# Patient Record
Sex: Male | Born: 1975 | Race: Black or African American | Hispanic: No | Marital: Single | Smoking: Never smoker
Health system: Southern US, Community
[De-identification: ages and names within clinical notes are randomized; demographics above are authoritative.]

## PROBLEM LIST (undated history)

## (undated) DIAGNOSIS — M1990 Unspecified osteoarthritis, unspecified site: Secondary | ICD-10-CM

## (undated) DIAGNOSIS — K759 Inflammatory liver disease, unspecified: Secondary | ICD-10-CM

## (undated) DIAGNOSIS — I10 Essential (primary) hypertension: Secondary | ICD-10-CM

---

## 2014-08-04 DIAGNOSIS — K759 Inflammatory liver disease, unspecified: Secondary | ICD-10-CM

## 2014-08-04 HISTORY — DX: Inflammatory liver disease, unspecified: K75.9

## 2014-08-08 ENCOUNTER — Encounter (HOSPITAL_BASED_OUTPATIENT_CLINIC_OR_DEPARTMENT_OTHER): Payer: Self-pay | Admitting: *Deleted

## 2014-08-08 ENCOUNTER — Emergency Department (HOSPITAL_BASED_OUTPATIENT_CLINIC_OR_DEPARTMENT_OTHER): Payer: Medicaid - Out of State

## 2014-08-08 ENCOUNTER — Inpatient Hospital Stay (HOSPITAL_BASED_OUTPATIENT_CLINIC_OR_DEPARTMENT_OTHER)
Admission: EM | Admit: 2014-08-08 | Discharge: 2014-08-10 | DRG: 442 | Disposition: A | Discharge status: Home / Self Care | Payer: Medicaid - Out of State | Attending: Family Medicine | Admitting: Family Medicine

## 2014-08-08 DIAGNOSIS — R17 Unspecified jaundice: Secondary | ICD-10-CM | POA: Diagnosis present

## 2014-08-08 DIAGNOSIS — K759 Inflammatory liver disease, unspecified: Secondary | ICD-10-CM

## 2014-08-08 DIAGNOSIS — R945 Abnormal results of liver function studies: Secondary | ICD-10-CM | POA: Diagnosis present

## 2014-08-08 DIAGNOSIS — I10 Essential (primary) hypertension: Secondary | ICD-10-CM | POA: Diagnosis present

## 2014-08-08 DIAGNOSIS — B179 Acute viral hepatitis, unspecified: Secondary | ICD-10-CM | POA: Diagnosis present

## 2014-08-08 DIAGNOSIS — M4310 Spondylolisthesis, site unspecified: Secondary | ICD-10-CM | POA: Diagnosis present

## 2014-08-08 DIAGNOSIS — B169 Acute hepatitis B without delta-agent and without hepatic coma: Principal | ICD-10-CM | POA: Diagnosis present

## 2014-08-08 DIAGNOSIS — M479 Spondylosis, unspecified: Secondary | ICD-10-CM | POA: Diagnosis present

## 2014-08-08 DIAGNOSIS — R109 Unspecified abdominal pain: Secondary | ICD-10-CM | POA: Diagnosis present

## 2014-08-08 HISTORY — DX: Inflammatory liver disease, unspecified: K75.9

## 2014-08-08 HISTORY — DX: Unspecified osteoarthritis, unspecified site: M19.90

## 2014-08-08 HISTORY — DX: Essential (primary) hypertension: I10

## 2014-08-08 LAB — COMPREHENSIVE METABOLIC PANEL
ALT: 3747 U/L — AB (ref 0–53)
AST: 2536 U/L — ABNORMAL HIGH (ref 0–37)
Albumin: 3.6 g/dL (ref 3.5–5.2)
Alkaline Phosphatase: 153 U/L — ABNORMAL HIGH (ref 39–117)
Anion gap: 13 (ref 5–15)
BUN: 6 mg/dL (ref 6–23)
CHLORIDE: 100 meq/L (ref 96–112)
CO2: 24 meq/L (ref 19–32)
Calcium: 9.5 mg/dL (ref 8.4–10.5)
Creatinine, Ser: 1.1 mg/dL (ref 0.50–1.35)
GFR calc Af Amer: >90 mL/min (ref >90)
GFR, EST NON AFRICAN AMERICAN: 84 mL/min — AB (ref >90)
GLUCOSE: 94 mg/dL (ref 70–99)
POTASSIUM: 4.3 meq/L (ref 3.7–5.3)
SODIUM: 137 meq/L (ref 137–147)
TOTAL PROTEIN: 7.8 g/dL (ref 6.0–8.3)
Total Bilirubin: 9.1 mg/dL — ABNORMAL HIGH (ref 0.3–1.2)

## 2014-08-08 LAB — URINALYSIS, ROUTINE W REFLEX MICROSCOPIC
Glucose, UA: NEGATIVE mg/dL
HGB URINE DIPSTICK: NEGATIVE
Ketones, ur: NEGATIVE mg/dL
Leukocytes, UA: NEGATIVE
Nitrite: NEGATIVE
PH: 6 (ref 5.0–8.0)
Protein, ur: NEGATIVE mg/dL
SPECIFIC GRAVITY, URINE: 1.009 (ref 1.005–1.030)
UROBILINOGEN UA: 0.2 mg/dL (ref 0.0–1.0)

## 2014-08-08 LAB — CBC WITH DIFFERENTIAL/PLATELET
Basophils Absolute: 0 10*3/uL (ref 0.0–0.1)
Basophils Relative: 1 % (ref 0–1)
Eosinophils Absolute: 0.1 10*3/uL (ref 0.0–0.7)
Eosinophils Relative: 2 % (ref 0–5)
HCT: 38.7 % — ABNORMAL LOW (ref 39.0–52.0)
HEMOGLOBIN: 13.5 g/dL (ref 13.0–17.0)
LYMPHS ABS: 1.2 10*3/uL (ref 0.7–4.0)
LYMPHS PCT: 37 % (ref 12–46)
MCH: 31.3 pg (ref 26.0–34.0)
MCHC: 34.9 g/dL (ref 30.0–36.0)
MCV: 89.8 fL (ref 78.0–100.0)
MONOS PCT: 16 % — AB (ref 3–12)
Monocytes Absolute: 0.5 10*3/uL (ref 0.1–1.0)
NEUTROS PCT: 44 % (ref 43–77)
Neutro Abs: 1.5 10*3/uL — ABNORMAL LOW (ref 1.7–7.7)
Platelets: 367 10*3/uL (ref 150–400)
RBC: 4.31 MIL/uL (ref 4.22–5.81)
RDW: 15.4 % (ref 11.5–15.5)
WBC: 3.3 10*3/uL — AB (ref 4.0–10.5)

## 2014-08-08 LAB — LIPASE, BLOOD: Lipase: 46 U/L (ref 11–59)

## 2014-08-08 MED ORDER — DIPHENHYDRAMINE HCL 50 MG/ML IJ SOLN
25.0000 mg | Freq: Once | INTRAMUSCULAR | Status: AC
  Administered 2014-08-08: 25 mg via INTRAVENOUS
  Filled 2014-08-08: qty 1

## 2014-08-08 MED ORDER — SODIUM CHLORIDE 0.9 % IV BOLUS (SEPSIS)
1000.0000 mL | Freq: Once | INTRAVENOUS | Status: AC
  Administered 2014-08-08: 1000 mL via INTRAVENOUS

## 2014-08-08 MED ORDER — IOHEXOL 300 MG/ML  SOLN
25.0000 mL | Freq: Once | INTRAMUSCULAR | Status: AC | PRN
  Administered 2014-08-08: 25 mL via ORAL

## 2014-08-08 MED ORDER — IOHEXOL 300 MG/ML  SOLN
100.0000 mL | Freq: Once | INTRAMUSCULAR | Status: AC | PRN
  Administered 2014-08-08: 100 mL via INTRAVENOUS

## 2014-08-08 NOTE — ED Notes (Signed)
Abdominal pain x 2 weeks. Urine is dark. Headache.

## 2014-08-08 NOTE — ED Provider Notes (Signed)
CSN: [id number]     Arrival date & time 08/08/14  1933 History  This chart was scribed for Tyler Hurley Tyler Wynder, MD by Evon Slackerrance Branch, ED Scribe. This patient was seen in room MH05/MH05 and the patient's care was started at 8:27 PM.     Chief Complaint  Patient presents with  . Abdominal Pain   The history is provided by the patient. No language interpreter was used.   HPI Comments: Tyler Hurley is a 38 y.o. male who presents to the Emergency Department complaining of left sided abdominal pain onset 3 weeks ago. He states that he has noticed that his eyes have been yellowish, darker than normal urine and decreased stool output. He states that he has been having associated sharp intermittent headache, nausea, weight loss, decreased appetite, and chills. He states that he drinks occasionally. Denies fever or other related symptoms.    Past Medical History  Diagnosis Date  . Hypertension    History reviewed. No pertinent past surgical history. No family history on file. History  Substance Use Topics  . Smoking status: Current Some Day Smoker  . Smokeless tobacco: Not on file  . Alcohol Use: No    Review of Systems  Constitutional: Positive for chills, appetite change and unexpected weight change. Negative for fever.  Gastrointestinal: Positive for nausea.  Neurological: Positive for headaches.  All other systems reviewed and are negative.   Allergies  Review of patient's allergies indicates no known allergies.  Home Medications   Prior to Admission medications   Not on File   Triage Vitals: BP 143/93 mmHg  Pulse 76  Temp(Src) 97.9 F (36.6 C) (Oral)  Resp 18  Ht 6\' 2"  (1.88 m)  Wt 200 lb (90.719 kg)  BMI 25.67 kg/m2  SpO2 98%  Physical Exam  Constitutional: He is oriented to person, place, and time. He appears well-developed and well-nourished. No distress.  HENT:  Head: Normocephalic and atraumatic.  Eyes: EOM are normal. Scleral icterus is present.  obvious jaundice    Neck: Neck supple.  Cardiovascular: Normal rate, regular rhythm and normal heart sounds.   Pulmonary/Chest: Effort normal and breath sounds normal. No respiratory distress.  Abdominal: There is tenderness. There is no rebound.  firmness in RUQ, mild tenderness   Musculoskeletal: Normal range of motion.  Neurological: He is alert and oriented to person, place, and time.  Skin: Skin is warm and dry.  Psychiatric: He has a normal mood and affect. His behavior is normal.  Nursing note and vitals reviewed.   ED Course  Procedures (including critical care time) DIAGNOSTIC STUDIES: Oxygen Saturation is 98% on RA, normal by my interpretation.    COORDINATION OF CARE: 8:47 PM-Discussed treatment plan which includes CT of abdomen, CBC panel, CMP, and  UA with pt at bedside and pt agreed to plan.     Labs Review Labs Reviewed  URINALYSIS, ROUTINE W REFLEX MICROSCOPIC - Abnormal; Notable for the following:    Color, Urine AMBER (*)    Bilirubin Urine LARGE (*)    All other components within normal limits  CBC WITH DIFFERENTIAL - Abnormal; Notable for the following:    WBC 3.3 (*)    HCT 38.7 (*)    Neutro Abs 1.5 (*)    Monocytes Relative 16 (*)    All other components within normal limits  COMPREHENSIVE METABOLIC PANEL - Abnormal; Notable for the following:    AST 2536 (*)    ALT 3747 (*)    Alkaline Phosphatase 153 (*)  Total Bilirubin 9.1 (*)    GFR calc non Af Amer 84 (*)    All other components within normal limits  LIPASE, BLOOD  HEPATITIS PANEL, ACUTE  ACETAMINOPHEN LEVEL  ETHANOL  URINE RAPID DRUG SCREEN (HOSP PERFORMED)    Imaging Review Ct Abdomen Pelvis W Contrast  08/08/2014   CLINICAL DATA:  Left lower quadrant pain, jaundice, nausea.  EXAM: CT ABDOMEN AND PELVIS WITH CONTRAST  TECHNIQUE: Multidetector CT imaging of the abdomen and pelvis was performed using the standard protocol following bolus administration of intravenous contrast.  CONTRAST:  25mL OMNIPAQUE  IOHEXOL 300 MG/ML SOLN, 100mL OMNIPAQUE IOHEXOL 300 MG/ML SOLN  COMPARISON:  None.  FINDINGS: Mild dependent changes in the lung bases. Focal new metaphyseal or bled of the in the right lung base.  The gallbladder is contracted. No radiopaque stone or wall thickening. No bile duct dilatation. The liver, spleen, pancreas, adrenal glands, kidneys, abdominal aorta, inferior vena cava, and retroperitoneal lymph nodes are unremarkable. Stomach is decompressed. Small bowel and colon are not abnormally distended. Small duodenal diverticulum. No free air or free fluid in the abdomen. Abdominal wall musculature appears intact.  Pelvis: The appendix is normal. Rectosigmoid colon is decompressed without evidence of diverticulitis. Bladder is decompressed. Mild bladder wall thickening is likely due to under distention. Prostate gland is not enlarged. No pelvic mass or lymphadenopathy. No free or loculated pelvic fluid collections. 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 No destructive bone lesions.  IMPRESSION: Gallbladder is contracted. No bile duct dilatation or pancreatic mass identified. No focal acute process demonstrated in the abdomen or pelvis. 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   Electronically Signed   By: Burman NievesWilliam  Stevens M.D.   On: 08/08/2014 21:35     EKG Interpretation None      MDM   Final diagnoses:  Jaundice   Tyler Hurley is a 38 y.o. male here with ab pain, jaundice. Concerned for possible mass vs hepatitis. CT unremarkable. Bili 9. LFTs elevated. Hepatitis panel sent. Patient may have drank more alcohol last weekend that possibly contribute to it. Will admit to tele.    I personally performed the services described in this documentation, which was scribed in my presence. The recorded information has been reviewed and is accurate.     Tyler Hurley My Madariaga, MD 08/08/14 [phone]

## 2014-08-09 ENCOUNTER — Encounter (HOSPITAL_COMMUNITY): Payer: Self-pay

## 2014-08-09 DIAGNOSIS — K7200 Acute and subacute hepatic failure without coma: Secondary | ICD-10-CM

## 2014-08-09 DIAGNOSIS — B179 Acute viral hepatitis, unspecified: Secondary | ICD-10-CM | POA: Diagnosis present

## 2014-08-09 LAB — CBC WITH DIFFERENTIAL/PLATELET
BASOS PCT: 1 % (ref 0–1)
Basophils Absolute: 0 10*3/uL (ref 0.0–0.1)
EOS PCT: 2 % (ref 0–5)
Eosinophils Absolute: 0.1 10*3/uL (ref 0.0–0.7)
HCT: 36.7 % — ABNORMAL LOW (ref 39.0–52.0)
Hemoglobin: 12.5 g/dL — ABNORMAL LOW (ref 13.0–17.0)
LYMPHS PCT: 37 % (ref 12–46)
Lymphs Abs: 1.3 10*3/uL (ref 0.7–4.0)
MCH: 30.6 pg (ref 26.0–34.0)
MCHC: 34.1 g/dL (ref 30.0–36.0)
MCV: 89.7 fL (ref 78.0–100.0)
MONO ABS: 0.5 10*3/uL (ref 0.1–1.0)
Monocytes Relative: 14 % — ABNORMAL HIGH (ref 3–12)
NEUTROS ABS: 1.6 10*3/uL — AB (ref 1.7–7.7)
Neutrophils Relative %: 46 % (ref 43–77)
PLATELETS: 340 10*3/uL (ref 150–400)
RBC: 4.09 MIL/uL — ABNORMAL LOW (ref 4.22–5.81)
RDW: 15.5 % (ref 11.5–15.5)
WBC: 3.5 10*3/uL — ABNORMAL LOW (ref 4.0–10.5)

## 2014-08-09 LAB — BASIC METABOLIC PANEL
Anion gap: 13 (ref 5–15)
BUN: 6 mg/dL (ref 6–23)
CO2: 23 mEq/L (ref 19–32)
Calcium: 9 mg/dL (ref 8.4–10.5)
Chloride: 105 mEq/L (ref 96–112)
Creatinine, Ser: 1.07 mg/dL (ref 0.50–1.35)
GFR calc Af Amer: >90 mL/min (ref >90)
GFR calc non Af Amer: 86 mL/min — ABNORMAL LOW (ref >90)
Glucose, Bld: 91 mg/dL (ref 70–99)
Potassium: 4.3 mEq/L (ref 3.7–5.3)
Sodium: 141 mEq/L (ref 137–147)

## 2014-08-09 LAB — HEPATIC FUNCTION PANEL
ALK PHOS: 140 U/L — AB (ref 39–117)
ALT: 3472 U/L — AB (ref 0–53)
AST: 2529 U/L — ABNORMAL HIGH (ref 0–37)
Albumin: 3.1 g/dL — ABNORMAL LOW (ref 3.5–5.2)
BILIRUBIN DIRECT: 6.4 mg/dL — AB (ref 0.0–0.3)
BILIRUBIN TOTAL: 8.2 mg/dL — AB (ref 0.3–1.2)
Indirect Bilirubin: 1.8 mg/dL — ABNORMAL HIGH (ref 0.3–0.9)
Total Protein: 6.9 g/dL (ref 6.0–8.3)

## 2014-08-09 LAB — RAPID URINE DRUG SCREEN, HOSP PERFORMED
AMPHETAMINES: NOT DETECTED
Barbiturates: NOT DETECTED
Benzodiazepines: NOT DETECTED
Cocaine: NOT DETECTED
Opiates: NOT DETECTED
TETRAHYDROCANNABINOL: POSITIVE — AB

## 2014-08-09 LAB — HIV ANTIBODY (ROUTINE TESTING W REFLEX): HIV: NONREACTIVE

## 2014-08-09 LAB — PROTIME-INR
INR: 1.21 (ref 0.00–1.49)
Prothrombin Time: 15.4 seconds — ABNORMAL HIGH (ref 11.6–15.2)

## 2014-08-09 LAB — ACETAMINOPHEN LEVEL: Acetaminophen (Tylenol), Serum: <15 ug/mL (ref 10–30)

## 2014-08-09 LAB — ETHANOL

## 2014-08-09 MED ORDER — SODIUM CHLORIDE 0.9 % IV SOLN
INTRAVENOUS | Status: AC
  Administered 2014-08-09: 17:00:00 via INTRAVENOUS

## 2014-08-09 MED ORDER — ONDANSETRON HCL 4 MG/2ML IJ SOLN: 4.0000 mg | Freq: Four times a day (QID) | INTRAMUSCULAR | Status: DC | PRN

## 2014-08-09 MED ORDER — ONDANSETRON HCL 4 MG PO TABS
4.0000 mg | ORAL_TABLET | Freq: Four times a day (QID) | ORAL | Status: DC | PRN
  Administered 2014-08-09 – 2014-08-10 (×4): 4 mg via ORAL
  Filled 2014-08-09 (×4): qty 1

## 2014-08-09 MED ORDER — OXYCODONE HCL 5 MG PO TABS
5.0000 mg | ORAL_TABLET | ORAL | Status: DC | PRN
  Administered 2014-08-09 – 2014-08-10 (×4): 5 mg via ORAL
  Filled 2014-08-09 (×4): qty 1

## 2014-08-09 MED ORDER — SODIUM CHLORIDE 0.9 % IV SOLN
INTRAVENOUS | Status: DC
  Administered 2014-08-09: 02:00:00 via INTRAVENOUS

## 2014-08-09 MED ORDER — INFLUENZA VAC SPLIT QUAD 0.5 ML IM SUSY
0.5000 mL | PREFILLED_SYRINGE | INTRAMUSCULAR | Status: AC
  Administered 2014-08-10: 0.5 mL via INTRAMUSCULAR
  Filled 2014-08-09: qty 0.5

## 2014-08-09 NOTE — Plan of Care (Signed)
Problem: Phase II Progression Outcomes Goal: Progress activity as tolerated unless otherwise ordered Outcome: Completed/Met Date Met:  08/09/14

## 2014-08-09 NOTE — Progress Notes (Signed)
Utilization review completed. Angelette Ganus, RN, BSN. 

## 2014-08-09 NOTE — H&P (Signed)
[facility] History and Physical  Tyler EmmerJamison Tonkinson WUJ:[id number] DOB: 03/02/1976 DOA: 08/08/2014  Referring physician: Patient was transferred from med Center [facility]. PCP: No PCP Per Patient  Chief Complaint: Eyes turning yellow.  HPI: Tyler Hurley is a [age] y.o. male with no significant past medical history presents to the ER with complaints of his eyes and urine turning yellow with some abdominal discomfort. Patient states that over the last 2 weeks he's been feeling nauseous with vomiting and vague abdominal discomfort mostly in the left lower quadrant. Patient also has been having poor appetite and he noticed that he has been turning yellow and he decided to come to the ER. Patient denies any recent travel or sick contacts or any new medications. In the ER patient's CT abdomen and pelvis was unremarkable and labs revealed jaundiced with markedly elevated LFTs. Tylenol levels were negative and acute hepatitis panel is pending.patient states her last use vomiting multiple times but over the last 1 week his vomiting has stopped but appetite has decreased considerably. Patient has been admitted for further management.   Review of Systems: As presented in the history of presenting illness, rest negative.  Past Medical History  Diagnosis Date  . Hypertension    History reviewed. No pertinent past surgical history. Social History:  reports that he has never smoked. He has never used smokeless tobacco. He reports that he drinks about 0.6 oz of alcohol per week. He reports that he does not use illicit drugs. Where does patient live home. Can patient participate in ADLs?yes.  No Known Allergies  Family History:  Family History  Problem Relation Age of Onset  . Liver disease Neg Hx       Prior to Admission medications   Not on File    Physical Exam: Filed Vitals:   08/08/14 1940 08/08/14 2244 08/09/14 0157  BP: 143/93 117/62 125/71  Pulse: 76 70 68  Temp: 97.9 F (36.6 C)  98.7 F (37.1 C) 98.6 F (37 C)  TempSrc: Oral Oral Oral  Resp: 18 18 16   Height: 6\' 2"  (1.88 m)    Weight: 90.719 kg (200 lb)  89.132 kg (196 lb 8 oz)  SpO2: 98% 98% 96%     General:  Well-built and nourished.  Eyes: icterus present no pallor.  ENT: no discharge from the ears eyes nose and mouth.  Neck: no mass felt.  Cardiovascular: S1-S2 heard.  Respiratory: no rhonchi or crepitations.  Abdomen: soft nontender bowel sounds present.  Skin: no rash.  Musculoskeletal: no edema.  Psychiatric: appears normal.  Neurologic: alert awake oriented to time place and person. Moves all extremities.  Labs on Admission:  Basic Metabolic Panel:  Recent Labs Lab 08/08/14 2034  NA 137  K 4.3  CL 100  CO2 24  GLUCOSE 94  BUN 6  CREATININE 1.10  CALCIUM 9.5   Liver Function Tests:  Recent Labs Lab 08/08/14 2034  AST 2536*  ALT 3747*  ALKPHOS 153*  BILITOT 9.1*  PROT 7.8  ALBUMIN 3.6    Recent Labs Lab 08/08/14 2034  LIPASE 46   No results for input(s): AMMONIA in the last 168 hours. CBC:  Recent Labs Lab 08/08/14 2034  WBC 3.3*  NEUTROABS 1.5*  HGB 13.5  HCT 38.7*  MCV 89.8  PLT 367   Cardiac Enzymes: No results for input(s): CKTOTAL, CKMB, CKMBINDEX, TROPONINI in the last 168 hours.  BNP (last 3 results) No results for input(s): PROBNP in the last 8760 hours. CBG:  No results for input(s): GLUCAP in the last 168 hours.  Radiological Exams on Admission: Ct Abdomen Pelvis W Contrast  08/08/2014   CLINICAL DATA:  Left lower quadrant pain, jaundice, nausea.  EXAM: CT ABDOMEN AND PELVIS WITH CONTRAST  TECHNIQUE: Multidetector CT imaging of the abdomen and pelvis was performed using the standard protocol following bolus administration of intravenous contrast.  CONTRAST:  25mL OMNIPAQUE IOHEXOL 300 MG/ML SOLN, 100mL OMNIPAQUE IOHEXOL 300 MG/ML SOLN  COMPARISON:  None.  FINDINGS: Mild dependent changes in the lung bases. Focal new metaphyseal or bled of  the in the right lung base.  The gallbladder is contracted. No radiopaque stone or wall thickening. No bile duct dilatation. The liver, spleen, pancreas, adrenal glands, kidneys, abdominal aorta, inferior vena cava, and retroperitoneal lymph nodes are unremarkable. Stomach is decompressed. Small bowel and colon are not abnormally distended. Small duodenal diverticulum. No free air or free fluid in the abdomen. Abdominal wall musculature appears intact.  Pelvis: The appendix is normal. Rectosigmoid colon is decompressed without evidence of diverticulitis. Bladder is decompressed. Mild bladder wall thickening is likely due to under distention. Prostate gland is not enlarged. No pelvic mass or lymphadenopathy. No free or loculated pelvic fluid collections. 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 No destructive bone lesions.  IMPRESSION: Gallbladder is contracted. No bile duct dilatation or pancreatic mass identified. No focal acute process demonstrated in the abdomen or pelvis. 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   Electronically Signed   By: Burman NievesWilliam  Stevens M.D.   On: 08/08/2014 21:35     Assessment/Plan Principal Problem:   Acute hepatitis Active Problems:   Hepatitis   1. Acute hepatitis with jaundice - cause not clear. Acute hepatitis panel is pending. Check INR to evaluate for any liver failure. Recheck LFTs primarily to see if jaundice is direct or indirect bilirubin. If LFTs continues to be high consult GI for further recommendations.    Code Status: full code.  Family Communication: none.  Disposition Plan: admit to inpatient.    Bevin Mayall N. [facility] Pager [phone].  If 7PM-7AM, please contact night-coverage [email or web address] Password [email or web address] 08/09/2014, 3:54 AM

## 2014-08-09 NOTE — Plan of Care (Signed)
Problem: Consults Goal: General Medical Patient Education See Patient Education Module for specific education. Outcome: Completed/Met Date Met:  08/09/14 Goal: Skin Care Protocol Initiated - if Braden Score 18 or less If consults are not indicated, leave blank or document N/A Outcome: Not Applicable Date Met:  00/93/81 Goal: Nutrition Consult-if indicated Outcome: Not Applicable Date Met:  82/99/37 Goal: Diabetes Guidelines if Diabetic/Glucose > 140 If diabetic or lab glucose is > 140 mg/dl - Initiate Diabetes/Hyperglycemia Guidelines & Document Interventions  Outcome: Not Applicable Date Met:  16/96/78  Problem: Phase I Progression Outcomes Goal: Pain controlled with appropriate interventions Outcome: Completed/Met Date Met:  08/09/14 Goal: OOB as tolerated unless otherwise ordered Outcome: Completed/Met Date Met:  08/09/14 Goal: Voiding-avoid urinary catheter unless indicated Outcome: Completed/Met Date Met:  08/09/14

## 2014-08-09 NOTE — Progress Notes (Signed)
Subjective: Patient admitted this morning, see details of H&P by Dr. Toniann FailKakrakandy. Patient has abnormal LFTs with mild elevation of AST and ALT.  Filed Vitals:   08/09/14 0941  BP: 119/62  Pulse: 75  Temp: 97.9 F (36.6 C)  Resp: 17    Chest: Clear Bilaterally Heart : S1S2 RRR Abdomen: Soft, nontender Ext : No edema Neuro: Alert, oriented x 3  A/P Acute hepatitis Unclear etiology at this time, labs have been obtained and results are pending. GI consultation    Meredeth IdeGagan S Ilhan Madan [facility] [phone]

## 2014-08-09 NOTE — Consult Note (Signed)
[facility] Consult: 11:08 AM 08/09/2014  LOS: 1 day    Referring Provider: Dr Darrick Meigs  Primary Care Physician:  No PCP Per Patient Primary Gastroenterologist:  unassigned    Reason for Consultation:  Acute hepatitis.    HPI: Tyler Hurley is a 38 y.o. male.  Generally healthy and on no regular meds or supplements.  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  About a year ago, in [place], he was inpt due to chest pain and "stomach problems".  Ruled out for MI and did not have EGD but was prescribed stomach med which he did not take.  Told at that time his LFTs were not normal but no extensive investigation pursued per pt recall.    3 weeks of malaise, anorexia, nausea.  Early on vomited but that resolved.  Up to 7# weight loss during the time frame.  5 days ago noticed dark urine, scleral icterus.  Stools became pale in last 3 days. Also some pruritus without rash.  No abdominal swelling, some discomfort/bloating left upper abdomen.  Went to urgent care where he was obviously icteric and bili 9.1 (majority is unconjugated), transaminases in 2000s/300s, alk phos 150s.  Acute hepatitis serology panel and HIV tests are pending. CT scan is unrevealing with non-specific GB contraction, normal liver and biliary tree.   Only travel is to [place]. Does eat fast food. Takes at most 3 Advil or Aleve per week, occasional Acetaminophen, but only used 2 per day for about 3 days, 3 weeks ago and not since. Drinks ETOH infrequently.  About 6 shots of liquor over the last month, and that is higher than usual.  No muscle boosting supplements.  Last unprotected sex 4 months ago, using condoms with his girlfriend since then.  No hx IVDA, had snorted cocaine in past but not for 4  years.  Has not had any contact with peersons who are ill.Incarcerated for 6 months at age 71. 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  Completed bachelors degree in accounting in the past 12 months.       Past Medical History  Diagnosis Date  . Hypertension   . DJD (degenerative joint disease)     left knee.  advised to have TKR in past.  injury occurred age 68.   . Jaundice due to hepatitis 08/2014    History reviewed. No pertinent past surgical history.  Prior to Admission medications   Not on File    Scheduled Meds: . [START ON 08/10/2014] Influenza vac split quadrivalent PF  0.5 mL Intramuscular Tomorrow-1000   Infusions: . sodium chloride 100 mL/hr at 08/09/14 0903   PRN Meds: ondansetron **OR** ondansetron (ZOFRAN) IV, oxyCODONE   Allergies as of 08/08/2014  . (No Known Allergies)    Family History  Problem Relation Age of Onset  . Liver disease Neg Hx     History   Social History  . Marital Status: Single    Spouse Name: N/A  Number of Children: N/A  . Years of Education: N/A   Occupational History  . Not on file.   Social History Main Topics  . Smoking status: Never Smoker   . Smokeless tobacco: Never Used  . Alcohol Use: 0.6 oz/week    1 Cans of beer per week  . Drug Use: No  . Sexual Activity: Yes   Other Topics Concern  . Not on file   Social History Narrative    REVIEW OF SYSTEMS: Constitutional:  Per history of present illness ENT:  No nose bleeds Pulm:  No shortness of breath or cough CV:  No palpitations, no LE edema. No chest pain GU:  No hematuria, no frequency.  Urine is dark, amber colored GI:  No dysphagia, no heartburn Heme:  No unusual bleeding or excessive bruising   Transfusions:  None MS:  Has had swelling in his left knee and some pain but it is not debilitating Neuro:  No headaches, no peripheral tingling or numbness Derm:  No itching, no rash or sores. Pruritus without rash mostly on the  arms. Some acne eruptions on the forehead recently which is unusual for him. His tattoos were performed when he was in his teens and 52s Endocrine:  No sweats or chills.  No polyuria or dysuria Immunization:  Not queried Travel:  None beyond local counties in last few months.    PHYSICAL EXAM: Vital signs in last 24 hours: Filed Vitals:   08/09/14 0941  BP: 119/62  Pulse: 75  Temp: 97.9 F (36.6 C)  Resp: 17   Wt Readings from Last 3 Encounters:  08/09/14 196 lb 8 oz (89.132 kg)    General: pleasant, comfortable but understandably anxious healthy appearing African-American gentleman Head: no asymmetry, nor signs of trauma, no swelling Eyes:  Icteric, no conjunctival pallor Ears:  Not hard of hearing  Nose:  No congestion or discharge Mouth:  Clear, moist, dentition in good repair. Neck:  No JVD, bruits, thyromegaly or masses Lungs:  Clear to auscultation and percussion bilaterally, no cough no shortness of breath Heart: Regular rate and rhythm. No murmurs rubs or gallops. S1 and S2 audible  Abdomen:  Soft, nondistended, active bowel sounds. No hepatosplenomegaly, no bruits, no masses. There is minor tenderness in the left upper abdomen.   Rectal: deferred   Musc/Skeltl: left knee is deformed Extremities:  No pedal edema, feet are warm.  Neurologic:  Oriented 3. No tremor. No limb weakness.  No gross deficits. Skin: some acne type eruptions on the forehead. No sores or rash on the limbs or trunk Tattoos:   Barely visible tattoos on the arms and upper left chest. Nodes:  No cervical adenopathy.   Psych:  Pleasant, cooperative, concerned and a bit anxious  Intake/Output from previous day:   Intake/Output this shift:    LAB RESULTS:  Recent Labs  08/08/14 2034 08/09/14 0353  WBC 3.3* 3.5*  HGB 13.5 12.5*  HCT 38.7* 36.7*  PLT 367 340   BMET Lab Results  Component Value Date   NA 141 08/09/2014   NA 137 08/08/2014   K 4.3 08/09/2014   K 4.3 08/08/2014   CL  105 08/09/2014   CL 100 08/08/2014   CO2 23 08/09/2014   CO2 24 08/08/2014   GLUCOSE 91 08/09/2014   GLUCOSE 94 08/08/2014   BUN 6 08/09/2014   BUN 6 08/08/2014   CREATININE 1.07 08/09/2014   CREATININE 1.10 08/08/2014   CALCIUM 9.0 08/09/2014   CALCIUM 9.5 08/08/2014  LFT  Recent Labs  08/08/14 2034 08/09/14 0353  PROT 7.8 6.9  ALBUMIN 3.6 3.1*  AST 2536* 2529*  ALT 3747* 3472*  ALKPHOS 153* 140*  BILITOT 9.1* 8.2*  BILIDIR  --  6.4*  IBILI  --  1.8*   PT/INR Lab Results  Component Value Date   INR 1.21 08/09/2014   Hepatitis Panel No results for input(s): HEPBSAG, HCVAB, HEPAIGM, HEPBIGM in the last 72 hours. Lipase     Component Value Date/Time   LIPASE 46 08/08/2014 2034    Drugs of Abuse     Component Value Date/Time   LABOPIA NONE DETECTED 08/08/2014 1941   COCAINSCRNUR NONE DETECTED 08/08/2014 1941   LABBENZ NONE DETECTED 08/08/2014 1941   AMPHETMU NONE DETECTED 08/08/2014 1941   THCU POSITIVE* 08/08/2014 1941   LABBARB NONE DETECTED 08/08/2014 1941     RADIOLOGY STUDIES: Ct Abdomen Pelvis W Contrast  08/08/2014   CLINICAL DATA:  Left lower quadrant pain, jaundice, nausea.  EXAM: CT ABDOMEN AND PELVIS WITH CONTRAST  TECHNIQUE: Multidetector CT imaging of the abdomen and pelvis was performed using the standard protocol following bolus administration of intravenous contrast.  CONTRAST:  31m OMNIPAQUE IOHEXOL 300 MG/ML SOLN, 1092mOMNIPAQUE IOHEXOL 300 MG/ML SOLN  COMPARISON:  None.  FINDINGS: Mild dependent changes in the lung bases. Focal new metaphyseal or bled of the in the right lung base.  The gallbladder is contracted. No radiopaque stone or wall thickening. No bile duct dilatation. The liver, spleen, pancreas, adrenal glands, kidneys, abdominal aorta, inferior vena cava, and retroperitoneal lymph nodes are unremarkable. Stomach is decompressed. Small bowel and colon are not abnormally distended. Small duodenal diverticulum. No free air or free fluid  in the abdomen. Abdominal wall musculature appears intact.  Pelvis: The appendix is normal. Rectosigmoid colon is decompressed without evidence of diverticulitis. Bladder is decompressed. Mild bladder wall thickening is likely due to under distention. Prostate gland is not enlarged. No pelvic mass or lymphadenopathy. No free or loculated pelvic fluid collections. 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 No destructive bone lesions.  IMPRESSION: Gallbladder is contracted. No bile duct dilatation or pancreatic mass identified. No focal acute process demonstrated in the abdomen or pelvis. 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   Electronically Signed   By: WiLucienne Capers.D.   On: 08/08/2014 21:35    ENDOSCOPIC STUDIES: None   IMPRESSION:   *  Acute hepatitis of unclear etiology.  Acute hepatitis panel pending Rule out acute viral hepatitis, rule out autoimmune hepatitis.     PLAN:     *  Ordered ANA, AMA, Ceruloplasmin.  *  Per Dr PyJoanie Coddington11/03/2014, 11:08 AM Pager: [phone]

## 2014-08-10 DIAGNOSIS — B169 Acute hepatitis B without delta-agent and without hepatic coma: Secondary | ICD-10-CM | POA: Insufficient documentation

## 2014-08-10 LAB — HEPATIC FUNCTION PANEL
ALBUMIN: 3.2 g/dL — AB (ref 3.5–5.2)
ALT: 3595 U/L — ABNORMAL HIGH (ref 0–53)
AST: 2680 U/L — ABNORMAL HIGH (ref 0–37)
Alkaline Phosphatase: 134 U/L — ABNORMAL HIGH (ref 39–117)
BILIRUBIN INDIRECT: 1.9 mg/dL — AB (ref 0.3–0.9)
Bilirubin, Direct: 6.4 mg/dL — ABNORMAL HIGH (ref 0.0–0.3)
TOTAL PROTEIN: 6.9 g/dL (ref 6.0–8.3)
Total Bilirubin: 8.3 mg/dL — ABNORMAL HIGH (ref 0.3–1.2)

## 2014-08-10 LAB — BASIC METABOLIC PANEL
Anion gap: 12 (ref 5–15)
BUN: 5 mg/dL — ABNORMAL LOW (ref 6–23)
CO2: 24 mEq/L (ref 19–32)
Calcium: 9.1 mg/dL (ref 8.4–10.5)
Chloride: 99 mEq/L (ref 96–112)
Creatinine, Ser: 1.14 mg/dL (ref 0.50–1.35)
GFR calc Af Amer: >90 mL/min (ref >90)
GFR calc non Af Amer: 80 mL/min — ABNORMAL LOW (ref >90)
Glucose, Bld: 78 mg/dL (ref 70–99)
Potassium: 4 mEq/L (ref 3.7–5.3)
Sodium: 135 mEq/L — ABNORMAL LOW (ref 137–147)

## 2014-08-10 LAB — PROTIME-INR
INR: 1.13 (ref 0.00–1.49)
Prothrombin Time: 14.6 seconds (ref 11.6–15.2)

## 2014-08-10 MED ORDER — OXYCODONE HCL 5 MG PO TABS: 5.0000 mg | ORAL_TABLET | ORAL | Status: AC | PRN

## 2014-08-10 MED ORDER — ONDANSETRON HCL 4 MG PO TABS: 4.0000 mg | ORAL_TABLET | Freq: Four times a day (QID) | ORAL | Status: AC | PRN

## 2014-08-10 NOTE — Discharge Summary (Signed)
Physician Discharge Summary  Tyler Hurley ZHY:[id number] DOB: 11/01/1975 DOA: 08/08/2014  PCP: No PCP Per Patient  Admit date: 08/08/2014 Discharge date: 08/10/2014  Time spent: *30 minutes  Recommendations for Outpatient Follow-up:  1. *[facility]  Discharge Diagnoses:  Principal Problem:   Acute hepatitis Active Problems:   Hepatitis   Discharge Condition: Stable  Diet recommendation: Regular diet  Filed Weights   08/08/14 1940 08/09/14 0157  Weight: 90.719 kg (200 lb) 89.132 kg (196 lb 8 oz)    History of present illness:  38 y.o. male with no significant past medical history presents to the ER with complaints of his eyes and urine turning yellow with some abdominal discomfort. Patient states that over the last 2 weeks he's been feeling nauseous with vomiting and vague abdominal discomfort mostly in the left lower quadrant. Patient also has been having poor appetite and he noticed that he has been turning yellow and he decided to come to the ER. Patient denies any recent travel or sick contacts or any new medications. In the ER patient's CT abdomen and pelvis was unremarkable and labs revealed jaundiced with markedly elevated LFTs. Tylenol levels were negative and acute hepatitis panel is pending.patient states her last use vomiting multiple times but over the last 1 week his vomiting has stopped but appetite has decreased considerably. Patient has been admitted for further management  Hospital Course:  1. Hepatitis- Patient has elevated AST and ALT,hepatitis B core antibody HB C IgM is reactive, liver enzymes have not improved much.HIV is negative, Discussed with Dr Rhea BeltonPyrtle, who says that patient can be discharged home as [place] to go to [place]. Patient will require twice every week lab, and Dr. Rhea BeltonPyrtle will call and arrange a Monday.will continue with Zofran when necessary 2. Abdominal pain- continue oxycodone when  necessary  Procedures:  none  Consultations:  gastroenterology  Discharge Exam: Filed Vitals:   08/10/14 1721  BP: 135/75  Pulse: 66  Temp: 98.3 F (36.8 C)  Resp: 18    Discharge Instructions You were cared for by a hospitalist during your hospital stay. If you have any questions about your discharge medications or the care you received while you were in the hospital after you are discharged, you can call the unit and asked to speak with the hospitalist on call if the hospitalist that took care of you is not available. Once you are discharged, your primary care physician will handle any further medical issues. Please note that NO REFILLS for any discharge medications will be authorized once you are discharged, as it is imperative that you return to your primary care physician (or establish a relationship with a primary care physician if you do not have one) for your aftercare needs so that they can reassess your need for medications and monitor your lab values.  Discharge Instructions    Diet - low sodium heart healthy    Complete by:  As directed      Increase activity slowly    Complete by:  As directed           Current Discharge Medication List    START taking these medications   Details  ondansetron (ZOFRAN) 4 MG tablet Take 1 tablet (4 mg total) by mouth every 6 (six) hours as needed for nausea. Qty: 30 tablet, Refills: 0    oxyCODONE (OXY IR/ROXICODONE) 5 MG immediate release tablet Take 1 tablet (5 mg total) by mouth every 4 (  four) hours as needed for severe pain. Qty: 30 tablet, Refills: 0       No Known Allergies    The results of significant diagnostics from this hospitalization (including imaging, microbiology, ancillary and laboratory) are listed below for reference.    Significant Diagnostic Studies: Ct Abdomen Pelvis W Contrast  08/08/2014   CLINICAL DATA:  Left lower quadrant pain, jaundice, nausea.  EXAM: CT ABDOMEN AND PELVIS WITH CONTRAST   TECHNIQUE: Multidetector CT imaging of the abdomen and pelvis was performed using the standard protocol following bolus administration of intravenous contrast.  CONTRAST:  25mL OMNIPAQUE IOHEXOL 300 MG/ML SOLN, 100mL OMNIPAQUE IOHEXOL 300 MG/ML SOLN  COMPARISON:  None.  FINDINGS: Mild dependent changes in the lung bases. Focal new metaphyseal or bled of the in the right lung base.  The gallbladder is contracted. No radiopaque stone or wall thickening. No bile duct dilatation. The liver, spleen, pancreas, adrenal glands, kidneys, abdominal aorta, inferior vena cava, and retroperitoneal lymph nodes are unremarkable. Stomach is decompressed. Small bowel and colon are not abnormally distended. Small duodenal diverticulum. No free air or free fluid in the abdomen. Abdominal wall musculature appears intact.  Pelvis: The appendix is normal. Rectosigmoid colon is decompressed without evidence of diverticulitis. Bladder is decompressed. Mild bladder wall thickening is likely due to under distention. Prostate gland is not enlarged. No pelvic mass or lymphadenopathy. No free or loculated pelvic fluid collections. 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 No destructive bone lesions.  IMPRESSION: Gallbladder is contracted. No bile duct dilatation or pancreatic mass identified. No focal acute process demonstrated in the abdomen or pelvis. 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   Electronically Signed   By: Burman NievesWilliam  Stevens M.D.   On: 08/08/2014 21:35    Microbiology: No results found for this or any previous visit (from the past 240 hour(s)).   Labs: Basic Metabolic Panel:  Recent Labs Lab 08/08/14 2034 08/09/14 0353 08/10/14 0515  NA 137 141 135*  K 4.3 4.3 4.0  CL 100 105 99  CO2 24 23 24   GLUCOSE 94 91 78  BUN 6 6 5*  CREATININE 1.10 1.07 1.14  CALCIUM 9.5 9.0 9.1   Liver Function Tests:  Recent Labs Lab 08/08/14 2034 08/09/14 0353  08/10/14 0515  AST 2536* 2529* 2680*  ALT 3747* 3472* 3595*  ALKPHOS 153* 140* 134*  BILITOT 9.1* 8.2* 8.3*  PROT 7.8 6.9 6.9  ALBUMIN 3.6 3.1* 3.2*    Recent Labs Lab 08/08/14 2034  LIPASE 46   No results for input(s): AMMONIA in the last 168 hours. CBC:  Recent Labs Lab 08/08/14 2034 08/09/14 0353  WBC 3.3* 3.5*  NEUTROABS 1.5* 1.6*  HGB 13.5 12.5*  HCT 38.7* 36.7*  MCV 89.8 89.7  PLT 367 340   Cardiac Enzymes: No results for input(s): CKTOTAL, CKMB, CKMBINDEX, TROPONINI in the last 168 hours. BNP: BNP (last 3 results) No results for input(s): PROBNP in the last 8760 hours. CBG: No results for input(s): GLUCAP in the last 168 hours.     SignedMauro Kaufmann:  Chesney Suares S  [facility] 08/10/2014, 6:28 PM

## 2014-08-10 NOTE — Progress Notes (Signed)
Progress Note   Subjective  No new complaint, mild itching. Nausea better with promethazine Tolerating diet No bleeding or abdominal pain currently Still with malaise   Objective  Vital signs in last 24 hours: Temp:  [97.9 F (36.6 C)-98.3 F (36.8 C)] 98.3 F (36.8 C) (11/07 1721) Pulse Rate:  [59-74] 66 (11/07 1721) Resp:  [17-18] 18 (11/07 1721) BP: (120-135)/(63-75) 135/75 mmHg (11/07 1721) SpO2:  [96 %-98 %] 98 % (11/07 1721) Last BM Date: 08/09/14 General:   Alert,  Well-developed,   in NAD Heart:  Regular rate and rhythm; no murmurs Abdomen:  Soft, nontender and nondistended. Normal bowel sounds, without guarding, and without rebound.   Extremities:  Without edema. Neurologic:  Alert and  oriented x4;  grossly normal neurologically. Psych:  Alert and cooperative. Normal mood and affect.  Intake/Output from previous day: 11/06 0701 - 11/07 0700 In: 240 [P.O.:240] Out: -  Intake/Output this shift:    Lab Results:  Recent Labs  08/08/14 2034 08/09/14 0353  WBC 3.3* 3.5*  HGB 13.5 12.5*  HCT 38.7* 36.7*  PLT 367 340   BMET  Recent Labs  08/08/14 2034 08/09/14 0353 08/10/14 0515  NA 137 141 135*  K 4.3 4.3 4.0  CL 100 105 99  CO2 24 23 24   GLUCOSE 94 91 78  BUN 6 6 5*  CREATININE 1.10 1.07 1.14  CALCIUM 9.5 9.0 9.1   LFT  Recent Labs  08/10/14 0515  PROT 6.9  ALBUMIN 3.2*  AST 2680*  ALT 3595*  ALKPHOS 134*  BILITOT 8.3*  BILIDIR 6.4*  IBILI 1.9*   PT/INR  Recent Labs  08/09/14 0353 08/10/14 0515  LABPROT 15.4* 14.6  INR 1.21 1.13   Hepatitis Panel  Recent Labs  08/09/14 0934  HEPBSAG POSITIVE*  HCVAB NEGATIVE  HEPAIGM NON REACTIVE  HEPBIGM Reactive*    Studies/Results: Ct Abdomen Pelvis W Contrast  08/08/2014   CLINICAL DATA:  Left lower quadrant pain, jaundice, nausea.  EXAM: CT ABDOMEN AND PELVIS WITH CONTRAST  TECHNIQUE: Multidetector CT imaging of the abdomen and pelvis was performed using the standard  protocol following bolus administration of intravenous contrast.  CONTRAST:  25mL OMNIPAQUE IOHEXOL 300 MG/ML SOLN, 100mL OMNIPAQUE IOHEXOL 300 MG/ML SOLN  COMPARISON:  None.  FINDINGS: Mild dependent changes in the lung bases. Focal new metaphyseal or bled of the in the right lung base.  The gallbladder is contracted. No radiopaque stone or wall thickening. No bile duct dilatation. The liver, spleen, pancreas, adrenal glands, kidneys, abdominal aorta, inferior vena cava, and retroperitoneal lymph nodes are unremarkable. Stomach is decompressed. Small bowel and colon are not abnormally distended. Small duodenal diverticulum. No free air or free fluid in the abdomen. Abdominal wall musculature appears intact.  Pelvis: The appendix is normal. Rectosigmoid colon is decompressed without evidence of diverticulitis. Bladder is decompressed. Mild bladder wall thickening is likely due to under distention. Prostate gland is not enlarged. No pelvic mass or lymphadenopathy. No free or loculated pelvic fluid collections. 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 No destructive bone lesions.  IMPRESSION: Gallbladder is contracted. No bile duct dilatation or pancreatic mass identified. No focal acute process demonstrated in the abdomen or pelvis. 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   Electronically Signed   By: Burman NievesWilliam  Stevens M.D.   On: 08/08/2014 21:35      Assessment & Plan  38 year old male with acute hepatitis B  1.  Acute hepatitis  B/elevated liver enzymes -- long discussion regarding diagnosis. Patient has acute viral hepatitis B. We discussed how this virus is transmitted, and that he should avoid sexual intercourse while actively infected. He is living in a hotel while doing [place] in [place] and [place] to [place] where [place] lives and has family support. --he will need close outpatient following of liver enzymes to  ensure eventual improvement in transaminitis but also bilirubin. INR needs to be followed closely to ensure coagulopathy does not worsen. INR decreased slightly today which is a good sign --Very likely he will clear this infection and become immune without the need for antiviral therapy. Antiviral therapy would be recommended if he develops coagulopathy, INR greater than 1.5 or worsening jaundice, bilirubin greater than 10. --He has a ride back tonight and I am okay with discharge with follow-up on Monday with gastroenterology/hepatology in [place] --My office will work to try to get him an appointment there --He understands our discussion regarding this viral hepatitis. I expect a complete recovery and eventual immunity. Chronic hepatitis B is quite rare, with fulminant hepatic failure even more rare (< 0.5%)  Principal Problem:   Acute hepatitis Active Problems:   Hepatitis     LOS: 2 days   Kirbie Stodghill M  08/10/2014, 6:34 PM

## 2014-08-10 NOTE — Plan of Care (Signed)
Problem: Phase I Progression Outcomes Goal: Hemodynamically stable Outcome: Completed/Met Date Met:  08/10/14 Goal: Other Phase I Outcomes/Goals Outcome: Completed/Met Date Met:  08/10/14  Problem: Phase II Progression Outcomes Goal: Vital signs remain stable Outcome: Completed/Met Date Met:  08/10/14 Goal: IV changed to normal saline lock Outcome: Completed/Met Date Met:  08/10/14 Goal: Obtain order to discontinue catheter if appropriate Outcome: Not Applicable Date Met:  09/32/35 Goal: Other Phase II Outcomes/Goals Outcome: Completed/Met Date Met:  08/10/14

## 2014-08-10 NOTE — Progress Notes (Signed)
[facility] PROGRESS NOTE  Tyler Hurley JXB:[id number] DOB: 09/03/1976 DOA: 08/08/2014 PCP: No PCP Per Patient  Assessment/Plan: 1. Hepatitis- Patient has elevated AST and ALT,hepatitis B core antibody HB C IgM is reactive, liver enzymes have not improved much.HIV is negative, other labs are pending at this time. Gastroenterology to follow. 2. Abdominal pain- continue oxycodone when necessary 3. DVT prophylaxis- SCDs  Code Status: full code Family Communication: *no family at bedside Disposition Plan: *to be decided   Consultants: Gastroenterology   Procedures:  None   Antibiotics:  None   HPI/Subjective: 38 y.o. male with no significant past medical history presents to the ER with complaints of his eyes and urine turning yellow with some abdominal discomfort. Patient states that over the last 2 weeks he's been feeling nauseous with vomiting and vague abdominal discomfort mostly in the left lower quadrant. Patient also has been having poor appetite and he noticed that he has been turning yellow and he decided to come to the ER. Patient denies any recent travel or sick contacts or any new medications. In the ER patient's CT abdomen and pelvis was unremarkable and labs revealed jaundiced with markedly elevated LFTs. Tylenol levels were negative and acute hepatitis panel is pending.patient states her last use vomiting multiple times but over the last 1 week his vomiting has stopped but appetite has decreased considerably. Patient has been admitted for further management  Objective: Filed Vitals:   08/10/14 1055  BP: 122/72  Pulse: 74  Temp: 97.9 F (36.6 C)  Resp: 18    Intake/Output Summary (Last 24 hours) at 08/10/14 1218 Last data filed at 08/09/14 2124  Gross per 24 hour  Intake    240 ml  Output      0 ml  Net    240 ml   Filed Weights   08/08/14 1940 08/09/14 0157  Weight: 90.719 kg (200 lb) 89.132 kg (196 lb 8 oz)    Exam:  Physical Exam: Head:  Normocephalic, atraumatic.  Eyes: No signs of jaundice, EOMI Lungs: Normal respiratory effort. B/L Clear to auscultation, no crackles or wheezes.  Heart: Regular RR. S1 and S2 normal  Abdomen: BS normoactive. Soft, Nondistended, non-tender.  Extremities: No pretibial edema, no erythema  Data Reviewed: Basic Metabolic Panel:  Recent Labs Lab 08/08/14 2034 08/09/14 0353 08/10/14 0515  NA 137 141 135*  K 4.3 4.3 4.0  CL 100 105 99  CO2 24 23 24   GLUCOSE 94 91 78  BUN 6 6 5*  CREATININE 1.10 1.07 1.14  CALCIUM 9.5 9.0 9.1   Liver Function Tests:  Recent Labs Lab 08/08/14 2034 08/09/14 0353 08/10/14 0515  AST 2536* 2529* 2680*  ALT 3747* 3472* 3595*  ALKPHOS 153* 140* 134*  BILITOT 9.1* 8.2* 8.3*  PROT 7.8 6.9 6.9  ALBUMIN 3.6 3.1* 3.2*    Recent Labs Lab 08/08/14 2034  LIPASE 46   No results for input(s): AMMONIA in the last 168 hours. CBC:  Recent Labs Lab 08/08/14 2034 08/09/14 0353  WBC 3.3* 3.5*  NEUTROABS 1.5* 1.6*  HGB 13.5 12.5*  HCT 38.7* 36.7*  MCV 89.8 89.7  PLT 367 340   Cardiac Enzymes: No results for input(s): CKTOTAL, CKMB, CKMBINDEX, TROPONINI in the last 168 hours. BNP (last 3 results) No results for input(s): PROBNP in the last 8760 hours. CBG: No results for input(s): GLUCAP in the last 168 hours.  No results found for this or any previous visit (from the past 240 hour(s)).   Studies: Ct  Abdomen Pelvis W Contrast  08/08/2014   CLINICAL DATA:  Left lower quadrant pain, jaundice, nausea.  EXAM: CT ABDOMEN AND PELVIS WITH CONTRAST  TECHNIQUE: Multidetector CT imaging of the abdomen and pelvis was performed using the standard protocol following bolus administration of intravenous contrast.  CONTRAST:  25mL OMNIPAQUE IOHEXOL 300 MG/ML SOLN, 100mL OMNIPAQUE IOHEXOL 300 MG/ML SOLN  COMPARISON:  None.  FINDINGS: Mild dependent changes in the lung bases. Focal new metaphyseal or bled of the in the right lung base.  The gallbladder is  contracted. No radiopaque stone or wall thickening. No bile duct dilatation. The liver, spleen, pancreas, adrenal glands, kidneys, abdominal aorta, inferior vena cava, and retroperitoneal lymph nodes are unremarkable. Stomach is decompressed. Small bowel and colon are not abnormally distended. Small duodenal diverticulum. No free air or free fluid in the abdomen. Abdominal wall musculature appears intact.  Pelvis: The appendix is normal. Rectosigmoid colon is decompressed without evidence of diverticulitis. Bladder is decompressed. Mild bladder wall thickening is likely due to under distention. Prostate gland is not enlarged. No pelvic mass or lymphadenopathy. No free or loculated pelvic fluid collections. 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 No destructive bone lesions.  IMPRESSION: Gallbladder is contracted. No bile duct dilatation or pancreatic mass identified. No focal acute process demonstrated in the abdomen or pelvis. 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   Electronically Signed   By: Burman NievesWilliam  Stevens M.D.   On: 08/08/2014 21:35    Scheduled Meds:  Continuous Infusions:   Principal Problem:   Acute hepatitis Active Problems:   Hepatitis    Time spent: *25 min    [facility]  [facility] Pager [phone]*. If 7PM-7AM, please contact night-coverage at [email or web address], password [email or web address] 08/10/2014, 12:18 PM  LOS: 2 days

## 2014-08-12 LAB — HEPATITIS PANEL, ACUTE
HCV Ab: NEGATIVE
HEP B S AG: POSITIVE — AB
Hep A IgM: NONREACTIVE
Hep B C IgM: REACTIVE — AB

## 2014-08-12 LAB — ANTI-NUCLEAR AB-TITER (ANA TITER): ANA TITER 1: NEGATIVE (ref <1:40)

## 2014-08-12 LAB — HEPATITIS B SURF AG CONFIRMATION: HEPATITIS B SURFACE ANTIGEN CONFIRMATION: POSITIVE — AB

## 2014-08-12 LAB — CERULOPLASMIN: Ceruloplasmin: 37 mg/dL — ABNORMAL HIGH (ref 18–36)

## 2014-08-12 LAB — ANA: Anti Nuclear Antibody(ANA): POSITIVE — AB

## 2014-08-13 ENCOUNTER — Telehealth: Payer: Self-pay

## 2014-08-13 LAB — MITOCHONDRIAL ANTIBODIES: Mitochondrial M2 Ab, IgG: 0.63 (ref <0.91)

## 2014-08-13 NOTE — Telephone Encounter (Signed)
[facility]

## 2014-08-13 NOTE — Telephone Encounter (Signed)
-----   Message from Beverley FiedlerJay M Pyrtle, MD sent at 08/10/2014  6:38 PM EST ----- Regarding: Hep B Pt's cell phone -- [phone] --Patient needs to establish care with [place] clinic [place] ASAP.  [facility] -- Acute hep B.  Needs serial lab monitoring at home. --Okay for work note if needed --[facility]: [address], [place], [place] [id number] [phone] --Call me with any problems

## 2014-08-15 NOTE — Telephone Encounter (Signed)
Called [place] office to confirm that they have received the records. Records have been received and they state they will contact the pt for appt.

## 2016-03-04 IMAGING — CT CT ABD-PELV W/ CM
2 of 4 series · 16 of 46 positions shown, 18 images · IV contrast (APPLIED)
Comparison: None.

CLINICAL DATA: Left lower quadrant pain, jaundice, nausea.

EXAM:
CT ABDOMEN AND PELVIS WITH CONTRAST
TECHNIQUE: Multidetector CT imaging of the abdomen and pelvis was performed
using the standard protocol following bolus administration of
intravenous contrast.
CONTRAST:  25mL OMNIPAQUE IOHEXOL 300 MG/ML SOLN, 100mL OMNIPAQUE
IOHEXOL 300 MG/ML SOLN

[Series 2: abd/pelvis 5.0 b31f · axial · 0.72mm/px · z∈[-454,-24]mm · 13 of 96 slices shown, 15 images]
[im 5/96  soft-tissue]
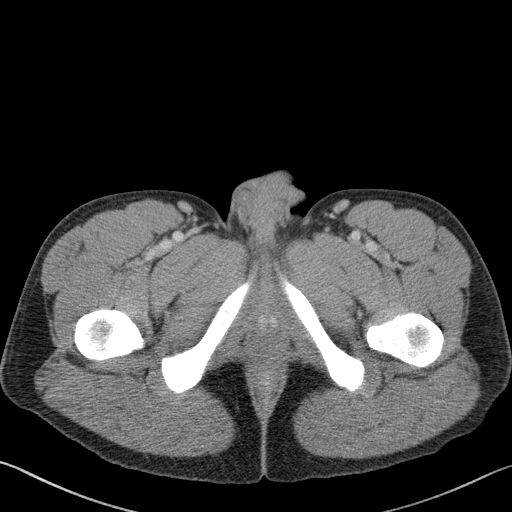
[im 5/96  bone]
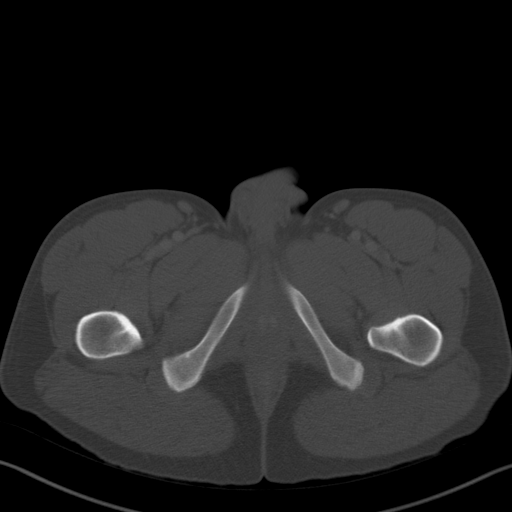
[im 13/96  soft-tissue]
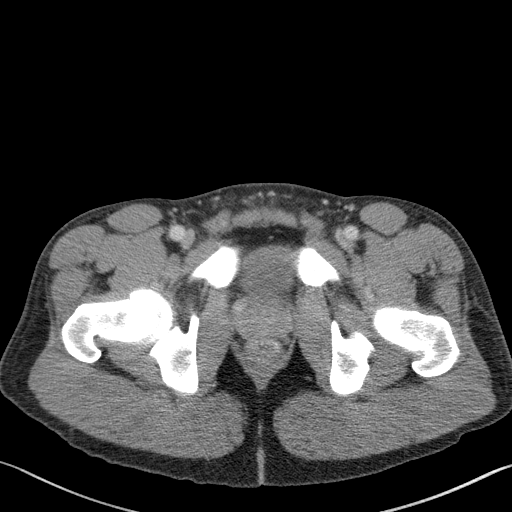
[im 21/96  soft-tissue]
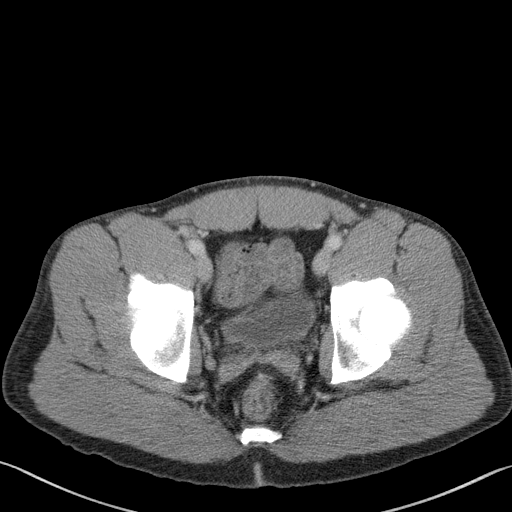
[im 25/96  soft-tissue]
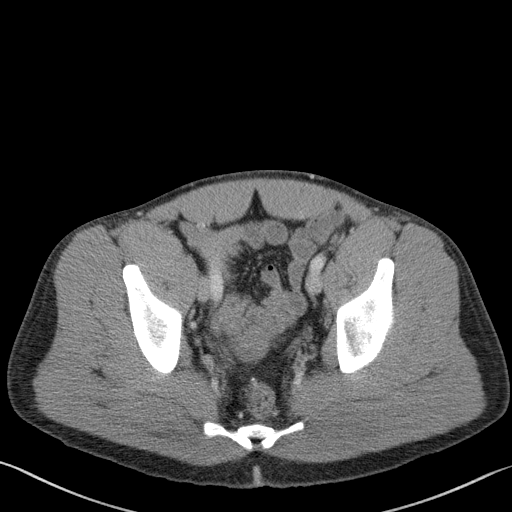
[im 34/96  soft-tissue]
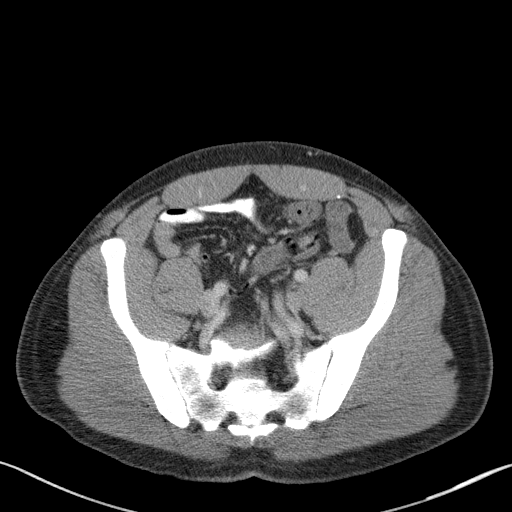
[im 42/96  soft-tissue]
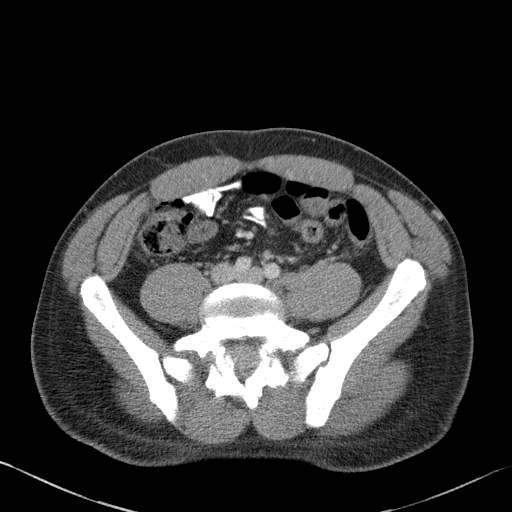
[im 50/96  soft-tissue]
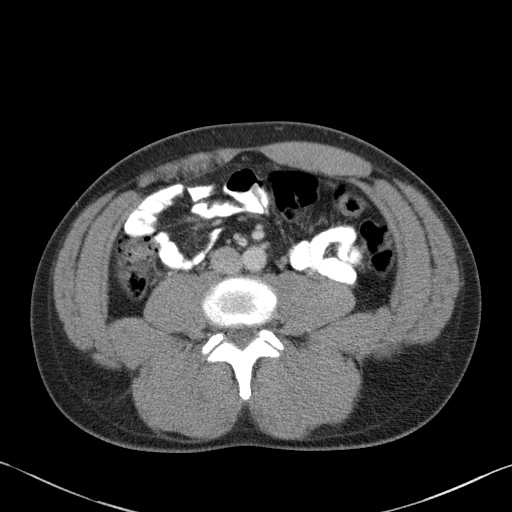
[im 54/96  soft-tissue]
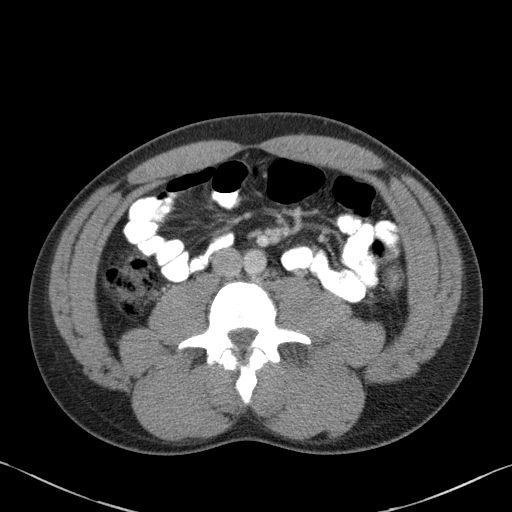
[im 62/96  soft-tissue]
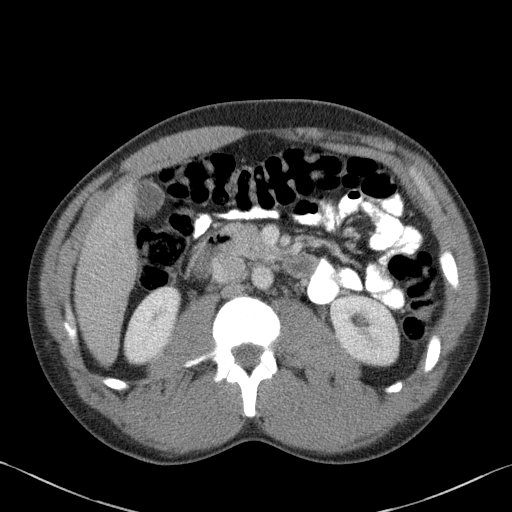
[im 62/96  bone]
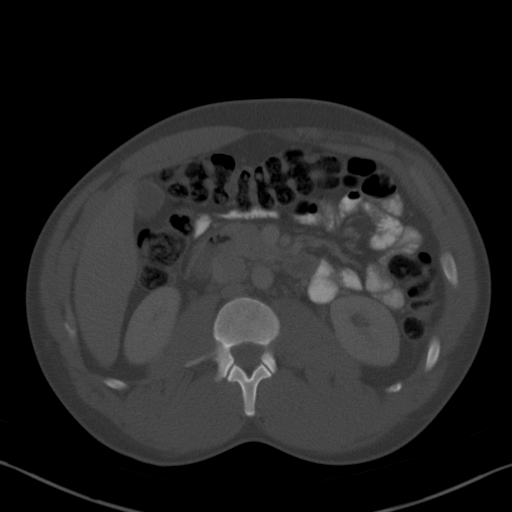
[im 71/96  soft-tissue]
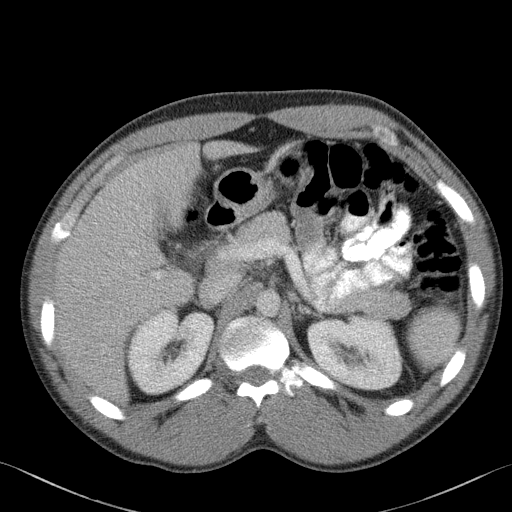
[im 75/96  soft-tissue]
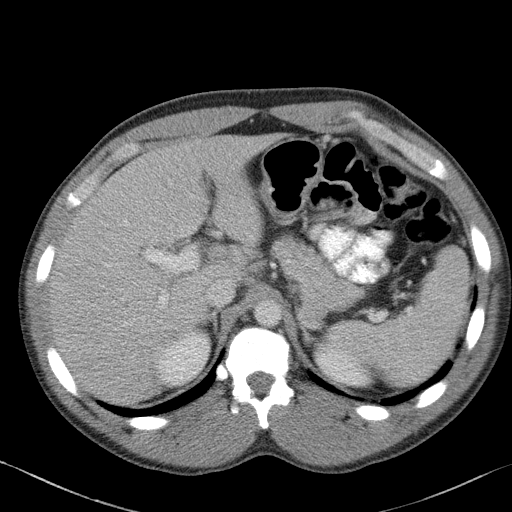
[im 83/96  soft-tissue]
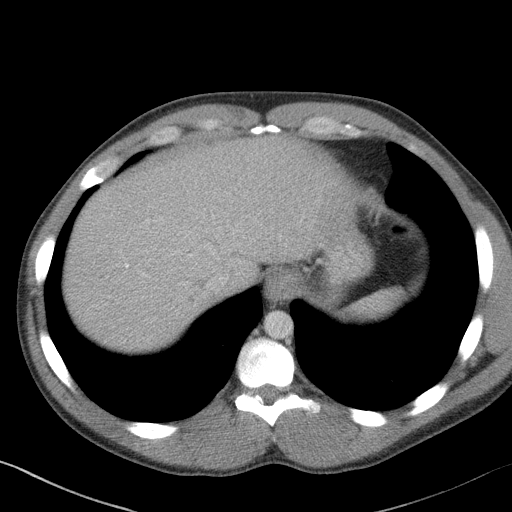
[im 91/96  soft-tissue]
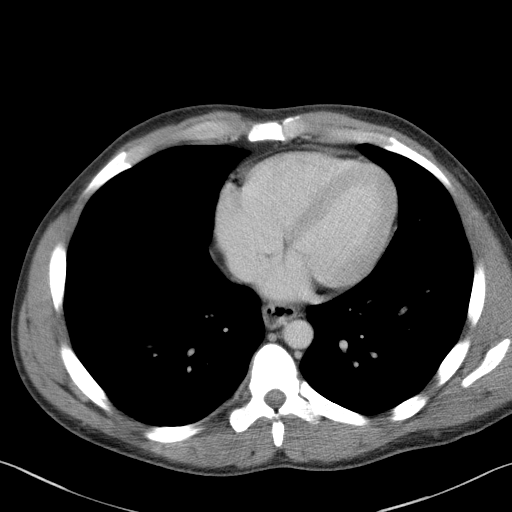

[Series 5: abd/pelvis 3.0 coronal · coronal · 0.78mm/px · 3 of 87 slices shown]
[im 29/87  soft-tissue]
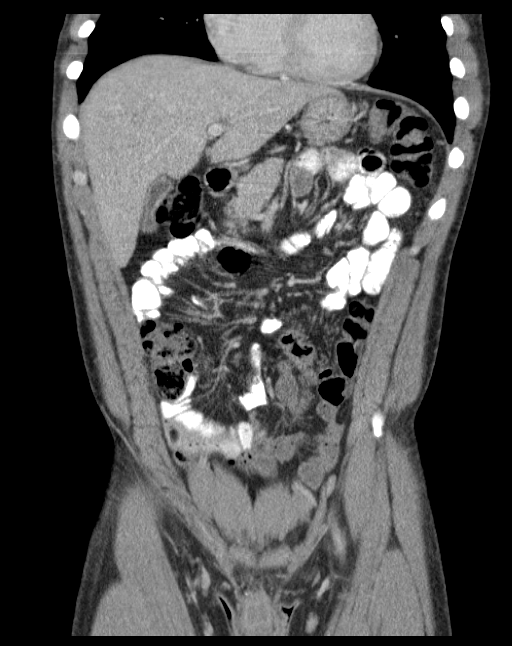
[im 39/87  soft-tissue]
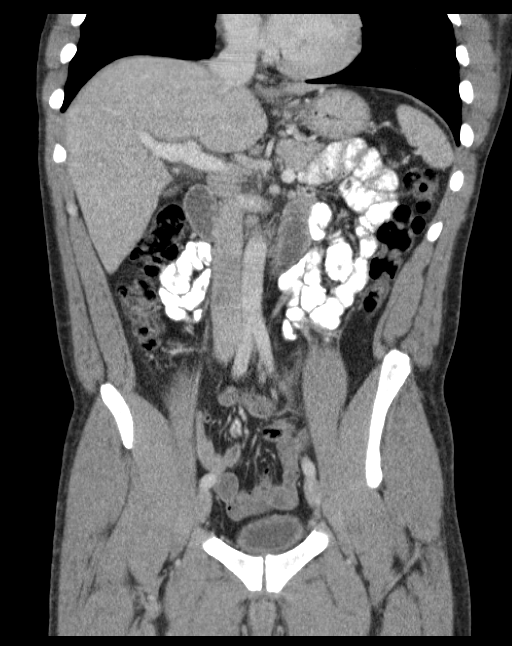
[im 48/87  soft-tissue]
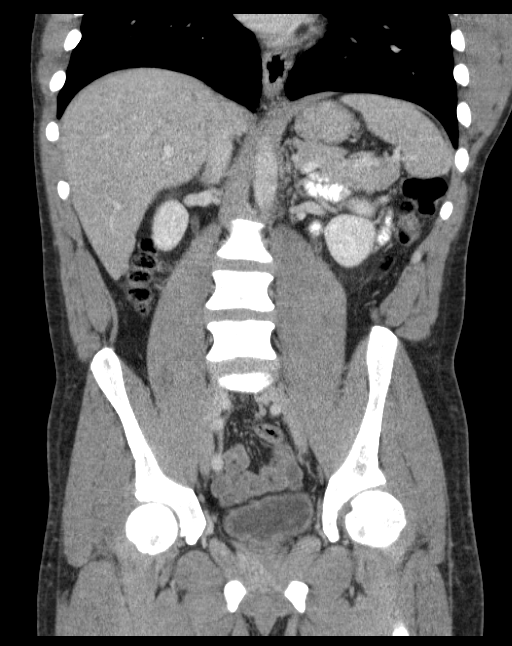

[16 of 46 positions shown; findings below may reference images not displayed]

FINDINGS: Mild dependent changes in the lung bases. Focal new metaphyseal or
bled of the in the right lung base.

The gallbladder is contracted. No radiopaque stone or wall
thickening. No bile duct dilatation. The liver, spleen, pancreas,
adrenal glands, kidneys, abdominal aorta, inferior vena cava, and
retroperitoneal lymph nodes are unremarkable. Stomach is
decompressed. Small bowel and colon are not abnormally distended.
Small duodenal diverticulum. No free air or free fluid in the
abdomen. Abdominal wall musculature appears intact.

Pelvis: The appendix is normal. Rectosigmoid colon is decompressed
without evidence of diverticulitis. Bladder is decompressed. Mild
bladder wall thickening is likely due to under distention. Prostate
gland is not enlarged. No pelvic mass or lymphadenopathy. No free or
loculated pelvic fluid collections. The spondylolysis with mild to
moderate spondylolisthesis of L5 on the sacrum. No destructive bone
lesions.
IMPRESSION: Gallbladder is contracted. No bile duct dilatation or pancreatic
mass identified. No focal acute process demonstrated in the abdomen
or pelvis. Spondylolysis and spondylolisthesis at L5 on the sacrum.
